# Patient Record
Sex: Male | Born: 1965 | Race: White | Hispanic: No | Smoking: Current every day smoker
Health system: Southern US, Community
[De-identification: ages and names within clinical notes are randomized; demographics above are authoritative.]

## PROBLEM LIST (undated history)

## (undated) DIAGNOSIS — J449 Chronic obstructive pulmonary disease, unspecified: Secondary | ICD-10-CM

## (undated) DIAGNOSIS — I10 Essential (primary) hypertension: Secondary | ICD-10-CM

## (undated) DIAGNOSIS — K219 Gastro-esophageal reflux disease without esophagitis: Secondary | ICD-10-CM

## (undated) DIAGNOSIS — R011 Cardiac murmur, unspecified: Secondary | ICD-10-CM

## (undated) DIAGNOSIS — F32A Depression, unspecified: Secondary | ICD-10-CM

## (undated) DIAGNOSIS — M1990 Unspecified osteoarthritis, unspecified site: Secondary | ICD-10-CM

## (undated) DIAGNOSIS — F419 Anxiety disorder, unspecified: Secondary | ICD-10-CM

## (undated) HISTORY — DX: Gastro-esophageal reflux disease without esophagitis: K21.9

## (undated) HISTORY — DX: Cardiac murmur, unspecified: R01.1

## (undated) HISTORY — DX: Depression, unspecified: F32.A

## (undated) HISTORY — DX: Anxiety disorder, unspecified: F41.9

## (undated) HISTORY — DX: Unspecified osteoarthritis, unspecified site: M19.90

---

## 2006-03-26 ENCOUNTER — Other Ambulatory Visit: Payer: Self-pay

## 2006-03-26 ENCOUNTER — Emergency Department: Payer: Self-pay | Admitting: Unknown Physician Specialty

## 2006-03-28 ENCOUNTER — Ambulatory Visit: Payer: Self-pay | Admitting: Unknown Physician Specialty

## 2006-04-17 ENCOUNTER — Emergency Department: Payer: Self-pay | Admitting: Internal Medicine

## 2007-07-08 IMAGING — CT CT HEAD WITHOUT CONTRAST
2 series · 16 of 30 positions shown, 20 images · non-contrast
Comparison: none

REASON FOR EXAM: Blurred vision, unsteady gait.
COMMENTS:

PROCEDURE:     CT  - CT HEAD WITHOUT CONTRAST  - March 26, 2006  [DATE]
RESULT:     No intraaxial or extraaxial pathologic fluid or blood
collections are identified.  No mass lesions are noted.  There is no
hydrocephalus.  No bony abnormalities are noted.

[Series 2: without · axial · non-contrast · 0.38mm/px · z∈[-627,-507]mm · 13 of 30 slices shown, 17 images]
[im 3/30  brain]
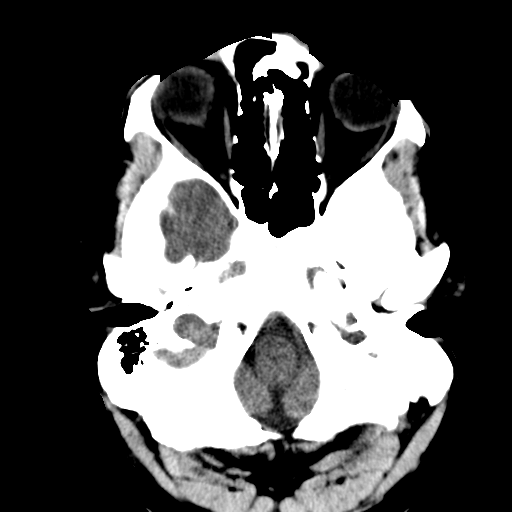
[im 3/30  bone]
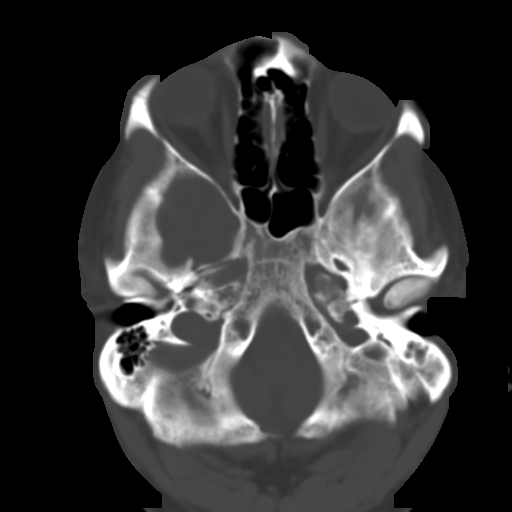
[im 5/30  brain]
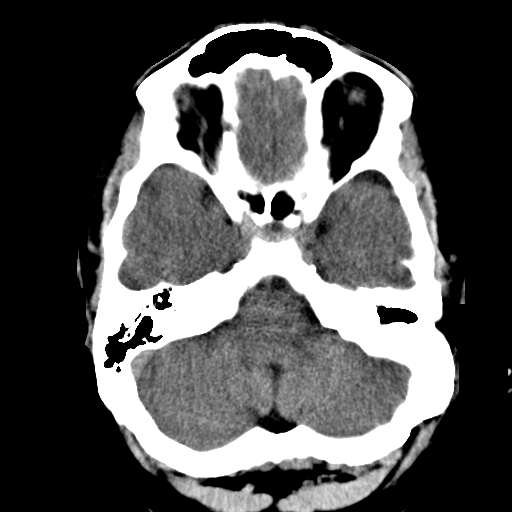
[im 7/30  brain]
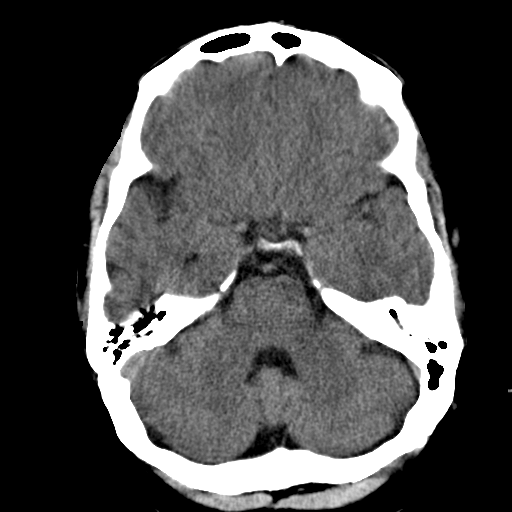
[im 9/30  brain]
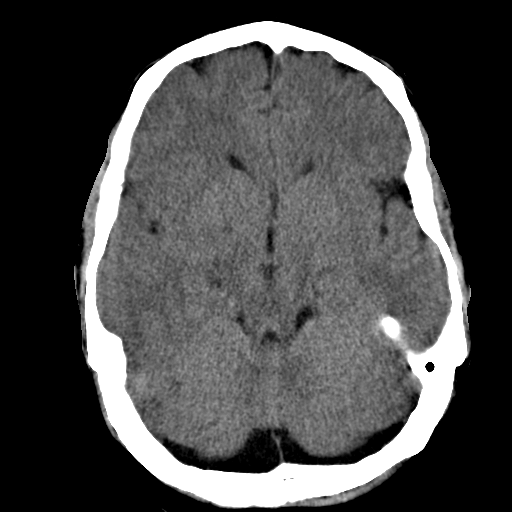
[im 11/30  brain]
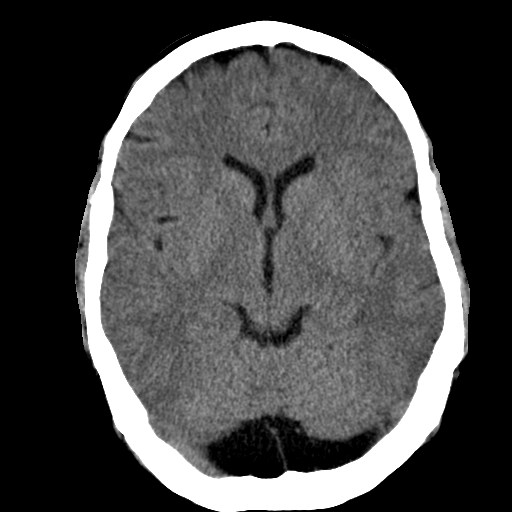
[im 11/30  bone]
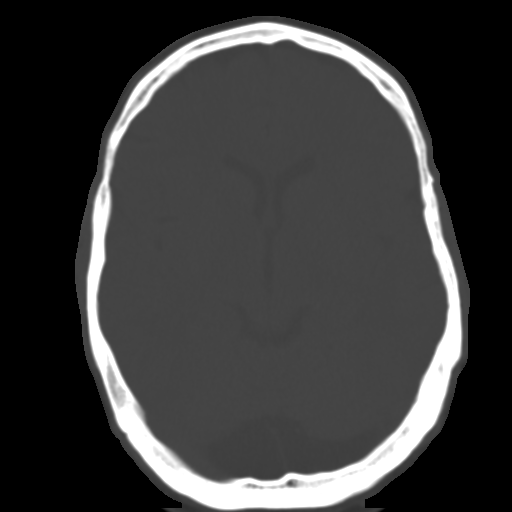
[im 13/30  brain]
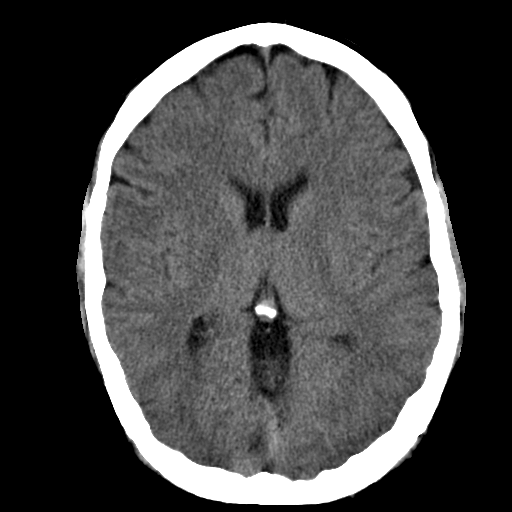
[im 15/30  brain]
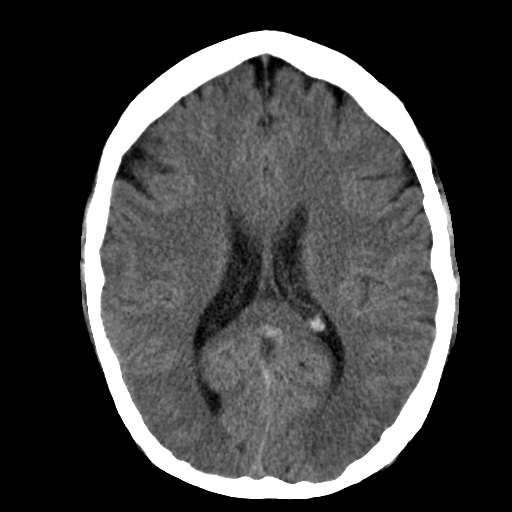
[im 17/30  brain]
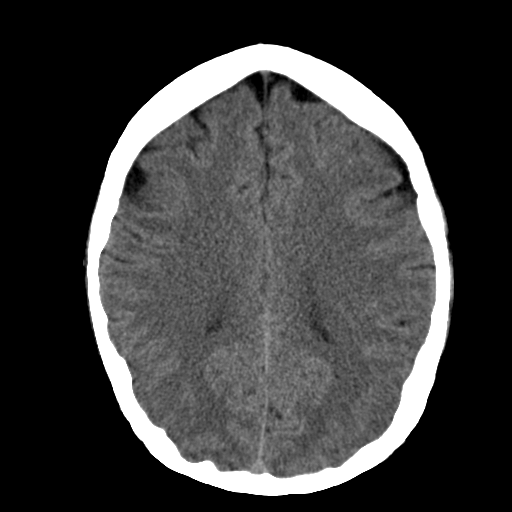
[im 19/30  brain]
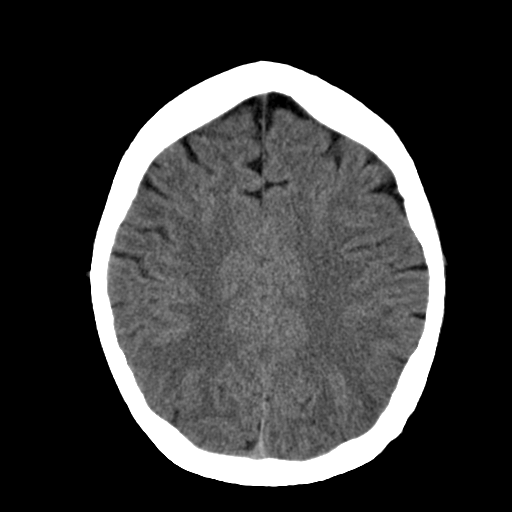
[im 19/30  bone]
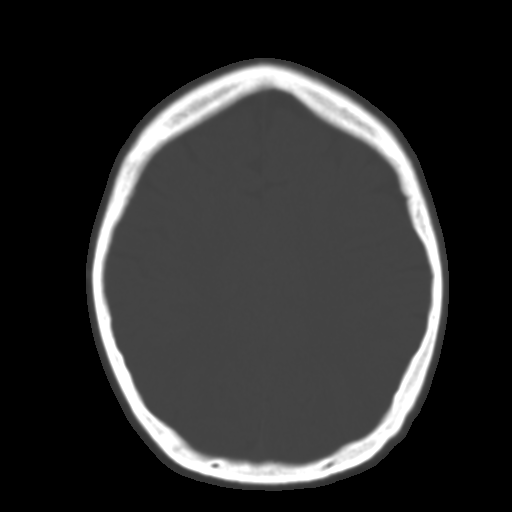
[im 21/30  brain]
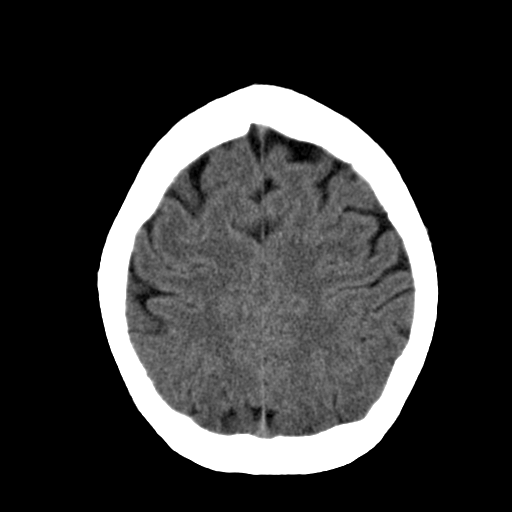
[im 23/30  brain]
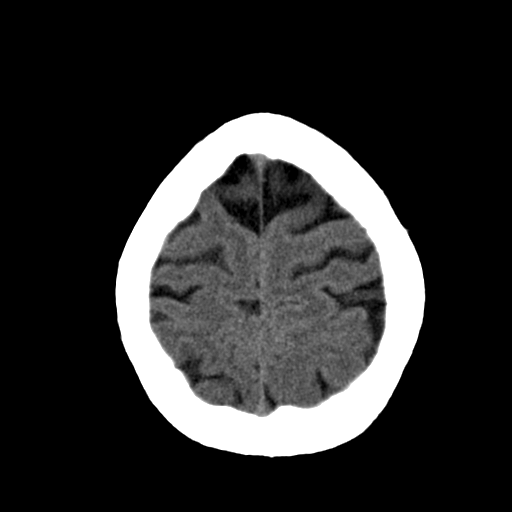
[im 25/30  brain]
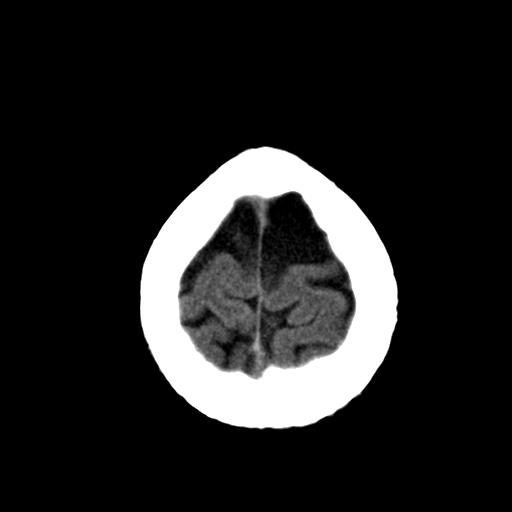
[im 27/30  brain]
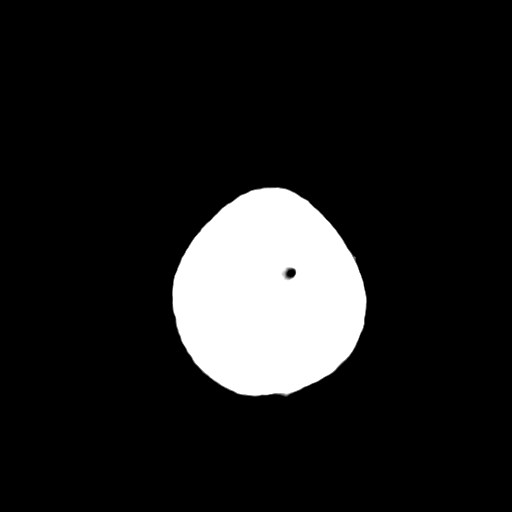
[im 27/30  bone]
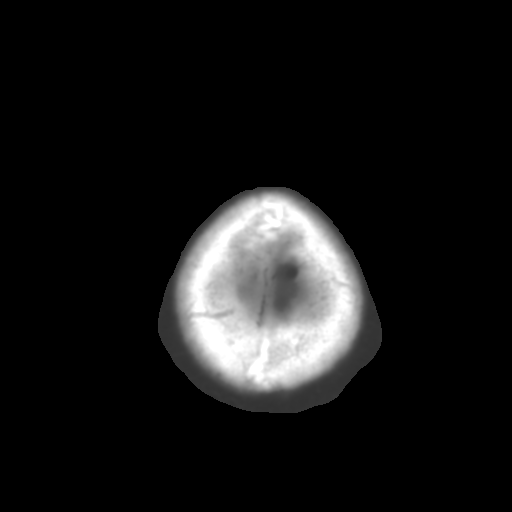

[Series 3: bone · axial · 0.38mm/px · z∈[-627,-587]mm · 3 of 30 slices shown]
[im 3/30  bone]
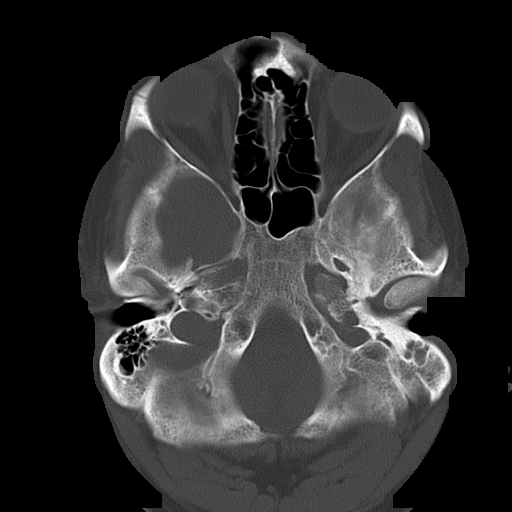
[im 7/30  bone]
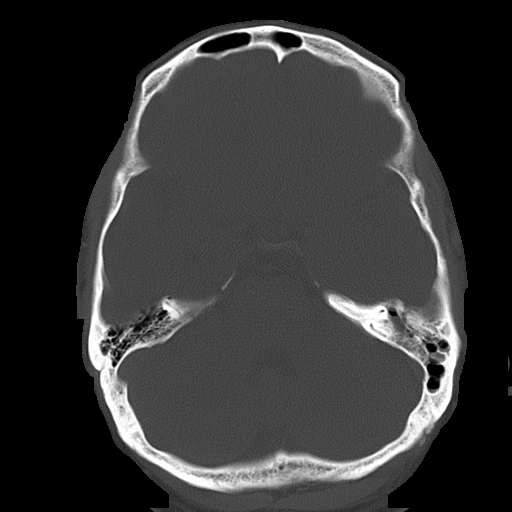
[im 11/30  bone]
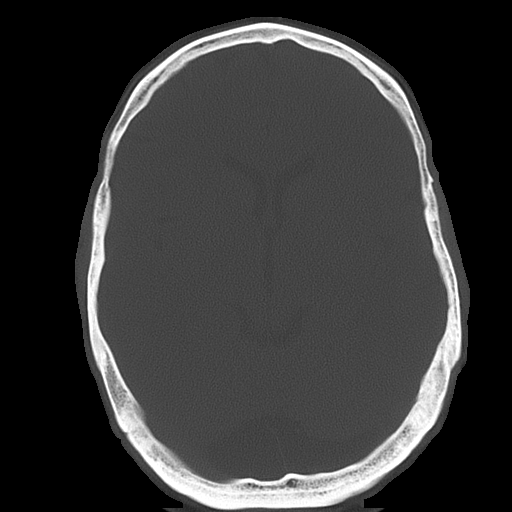

[16 of 30 positions shown; findings below may reference images not displayed]

IMPRESSION: No acute intracranial abnormalities identified.  This report was phoned to
the emergency room physician at the time of the study.

## 2007-07-10 IMAGING — US US CAROTID DUPLEX BILAT
1 series · 17 of 24 positions shown · non-contrast
Comparison: none

REASON FOR EXAM: Vertigo
COMMENTS:

[Series 1: us carotid duplex bilat · 17 of 66 slices shown]
[im 1/66]
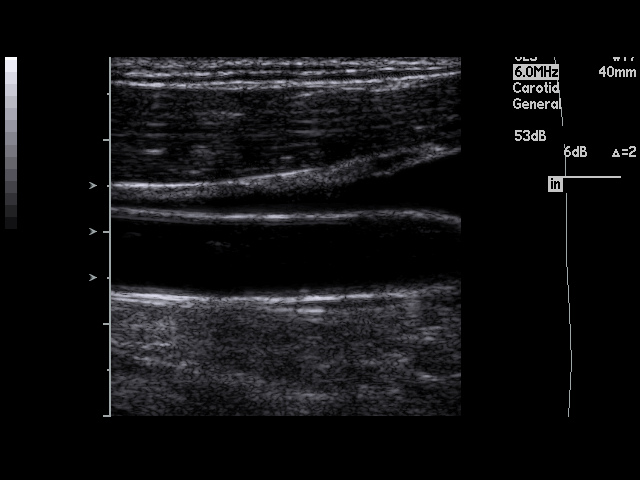
[im 6/66]
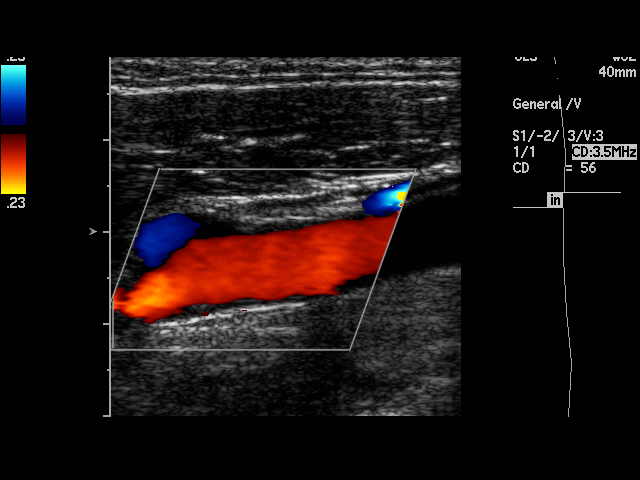
[im 9/66]
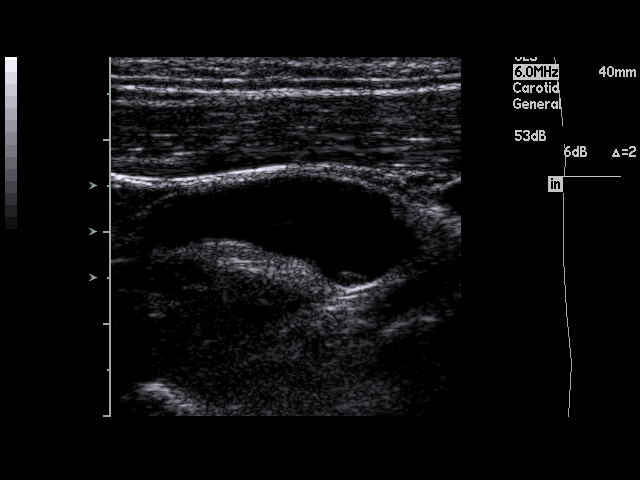
[im 12/66]
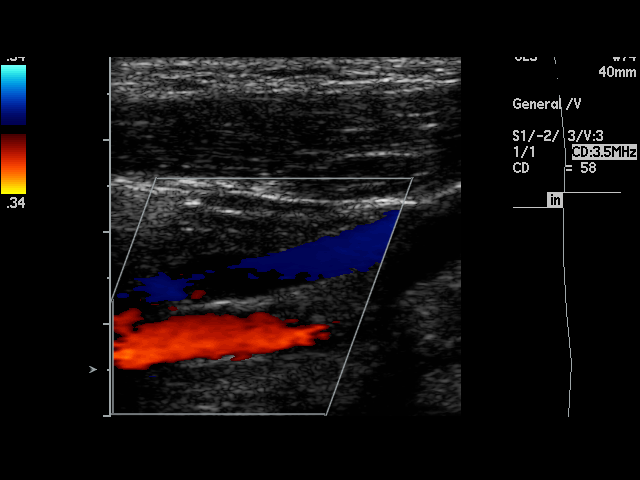
[im 17/66]
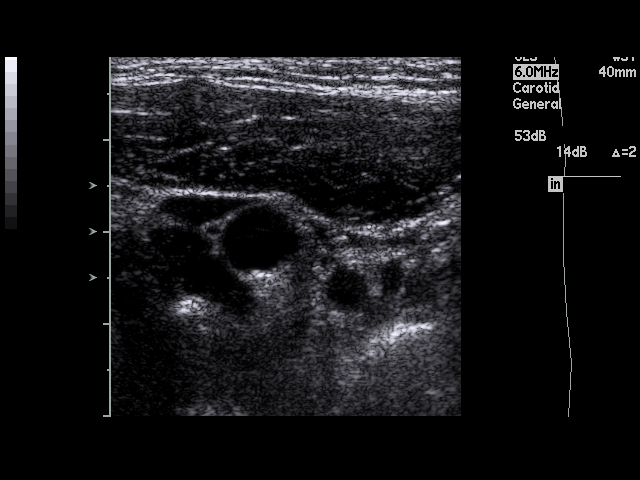
[im 20/66]
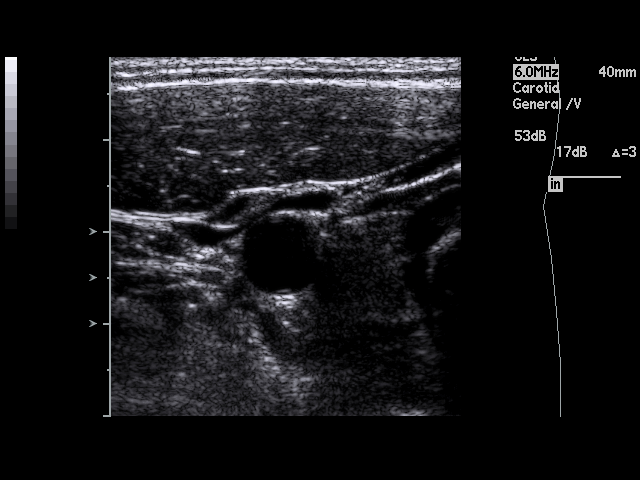
[im 26/66]
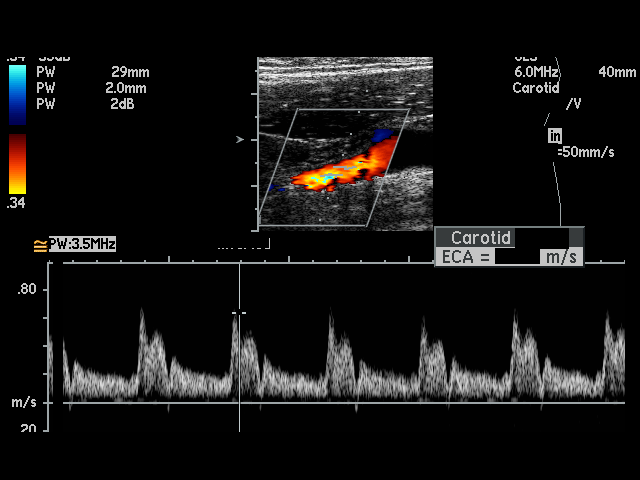
[im 29/66]
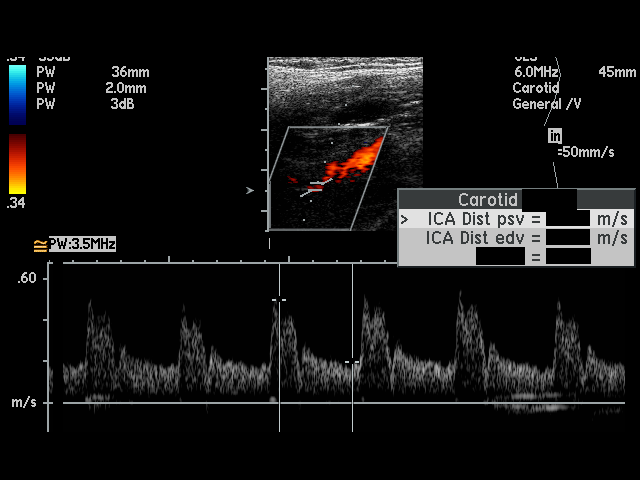
[im 34/66]
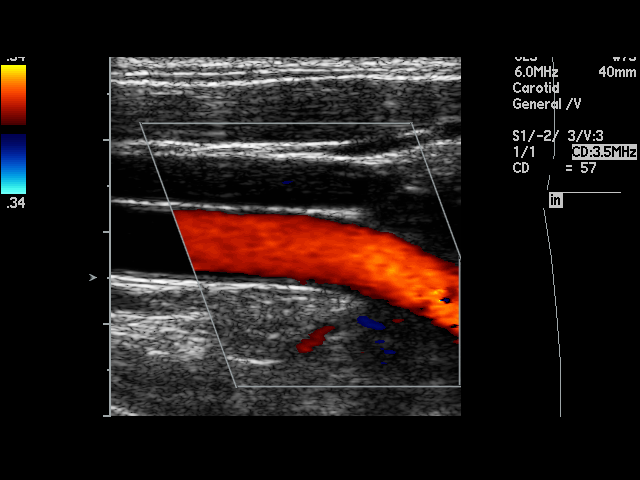
[im 37/66]
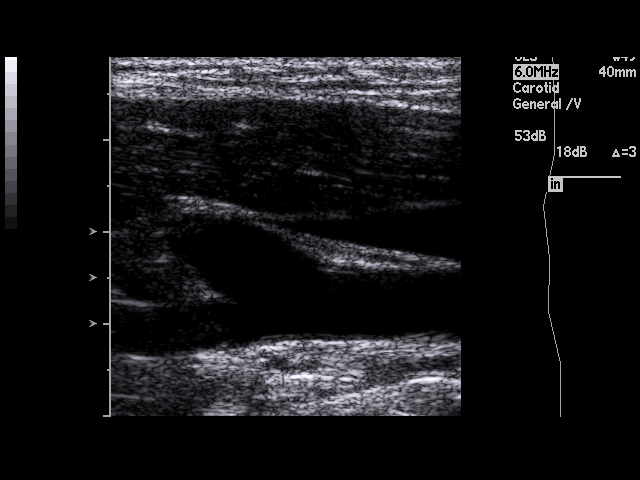
[im 40/66]
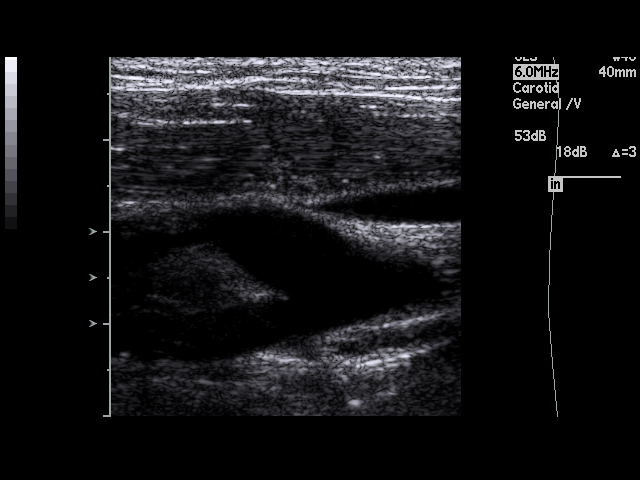
[im 46/66]
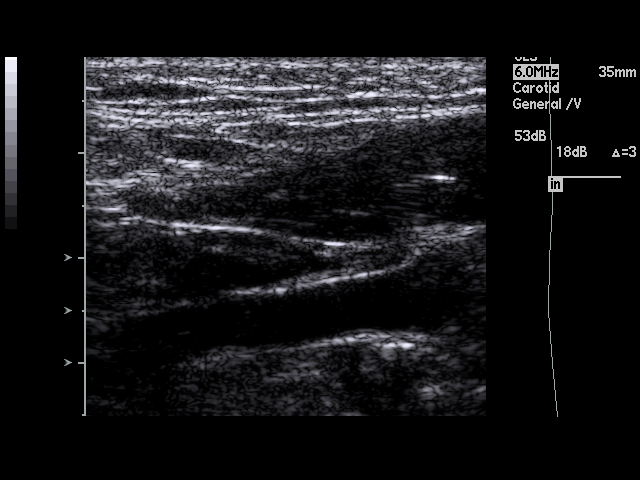
[im 49/66]
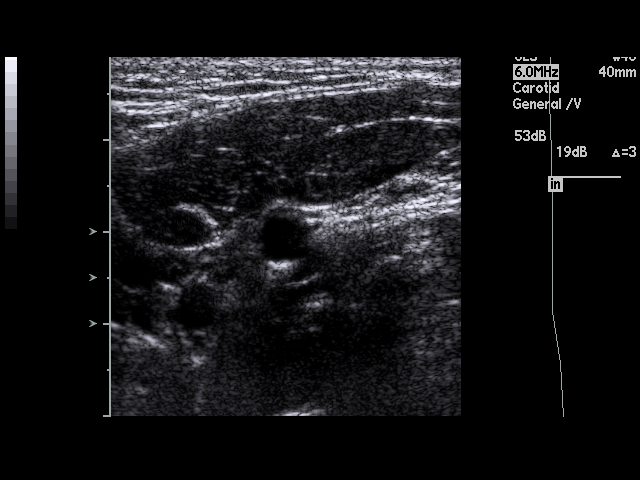
[im 54/66]
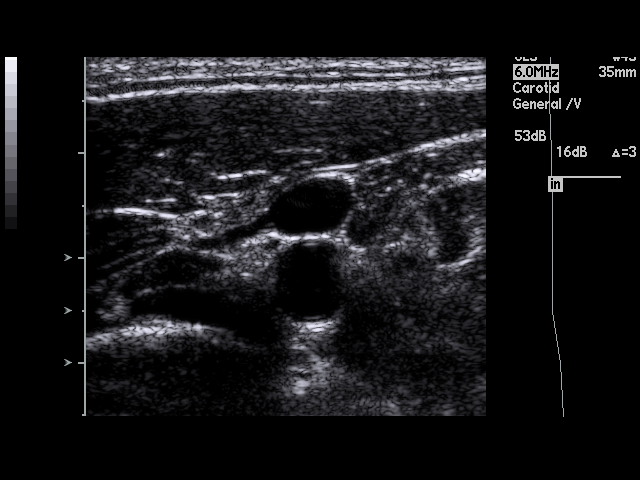
[im 57/66]
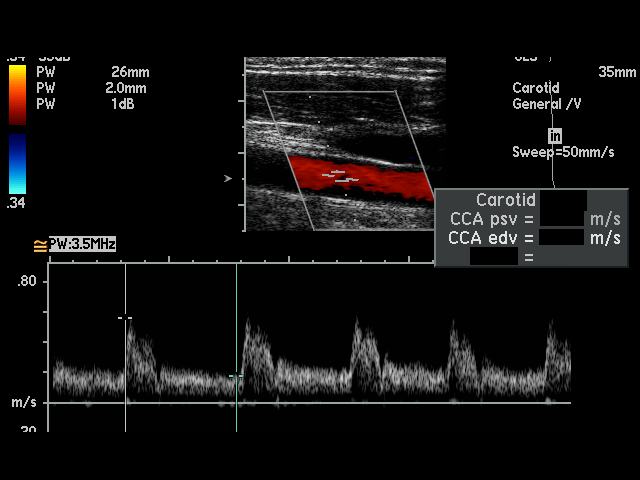
[im 60/66]
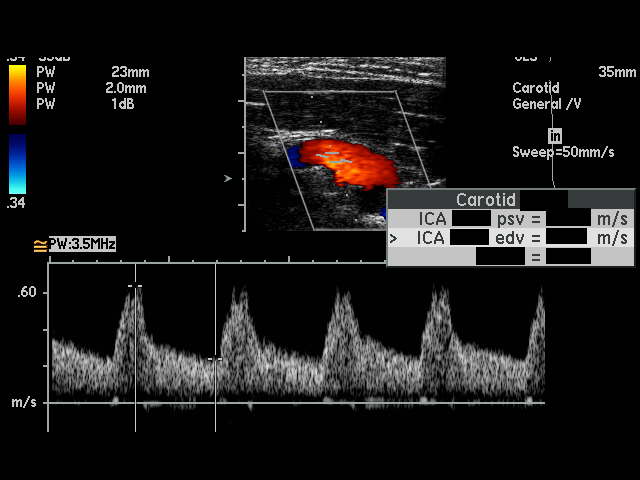
[im 66/66]
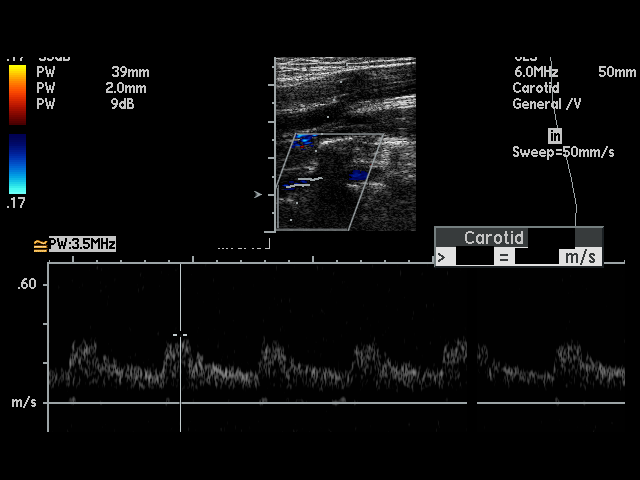

[17 of 24 positions shown; findings below may reference images not displayed]

PROCEDURE:     US  - US CAROTID DOPPLER BILATERAL  - March 28, 2006 [DATE]

RESULT:     There is noted slight soft plaque formation about the carotid
bifurcation on the RIGHT. No plaque formation is seen on the LEFT. On the
RIGHT the peak RIGHT common carotid artery flow velocity measures .730
meters per second and the peak RIGHT internal carotid artery flow velocity
measures .651 meters per second. The IC/CC ratio is 0.892.  On the LEFT the
peak LEFT common carotid artery flow velocity measures .757 meters per
second and the peak LEFT internal carotid artery flow velocity measures .677
meters per second. The IC/CC ratio is 0.894.  These values bilaterally are
compatible with the absence of hemodynamically significant stenosis.

Antegrade flow is noted in both vertebrals.
IMPRESSION: 1)No significant plaque formation or stenosis is seen on either side.

2)Antegrade flow is present in both vertebrals.

## 2007-11-08 ENCOUNTER — Emergency Department: Payer: Self-pay | Admitting: Emergency Medicine

## 2008-01-03 ENCOUNTER — Emergency Department: Payer: Self-pay | Admitting: Emergency Medicine

## 2009-02-19 IMAGING — CR DG ANKLE COMPLETE 3+V*L*
1 series · 5 of 5 positions shown · non-contrast
Comparison: none

REASON FOR EXAM: INJURY HEARD ANKLE POP
COMMENTS:

[Series 1: view not recorded · 0.17mm/px · 5 of 5 slices shown]
[im 1/5]
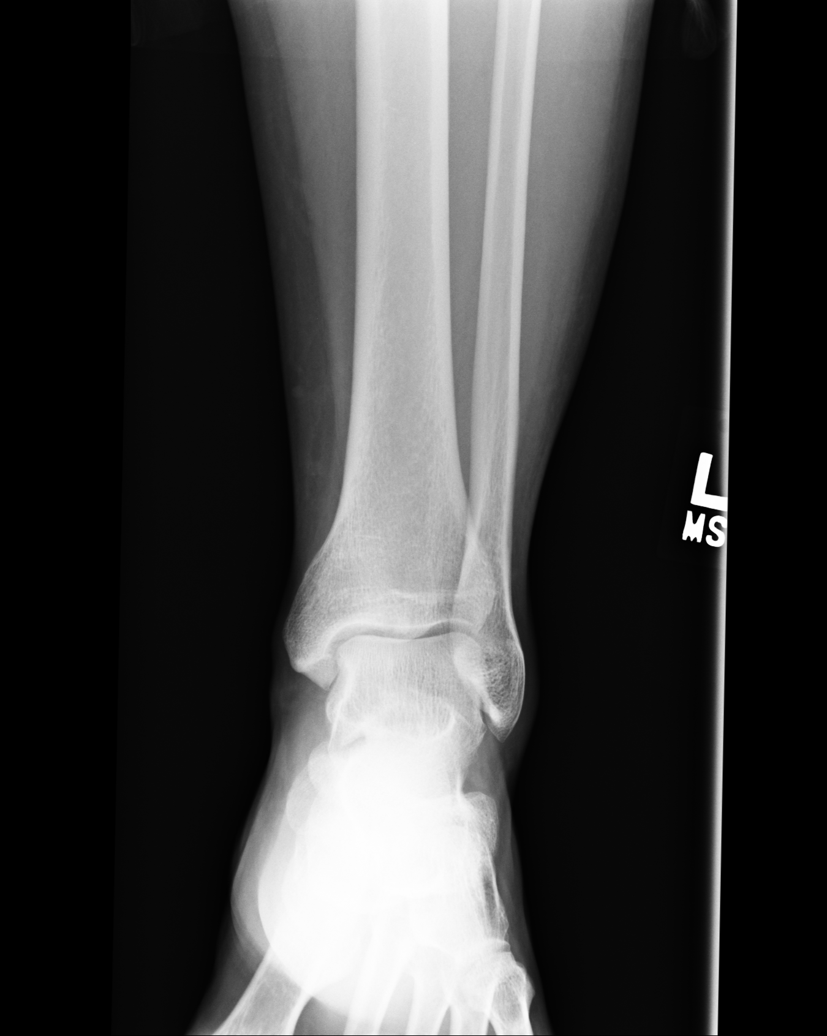
[im 2/5]
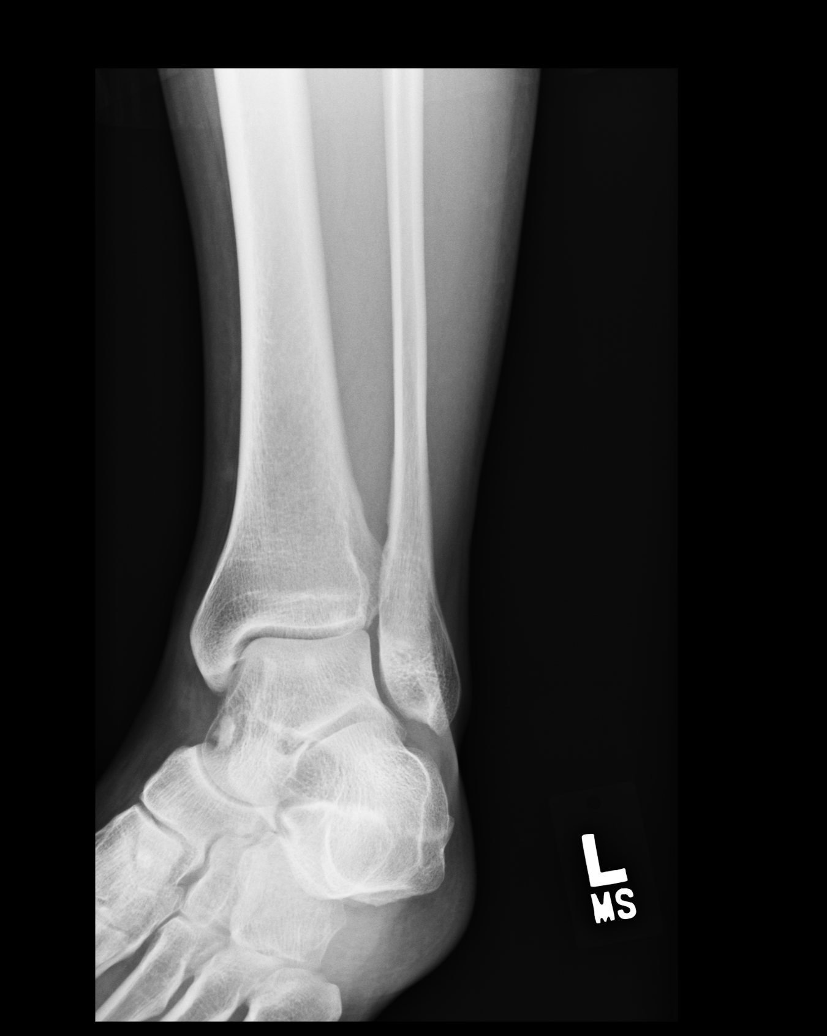
[im 3/5]
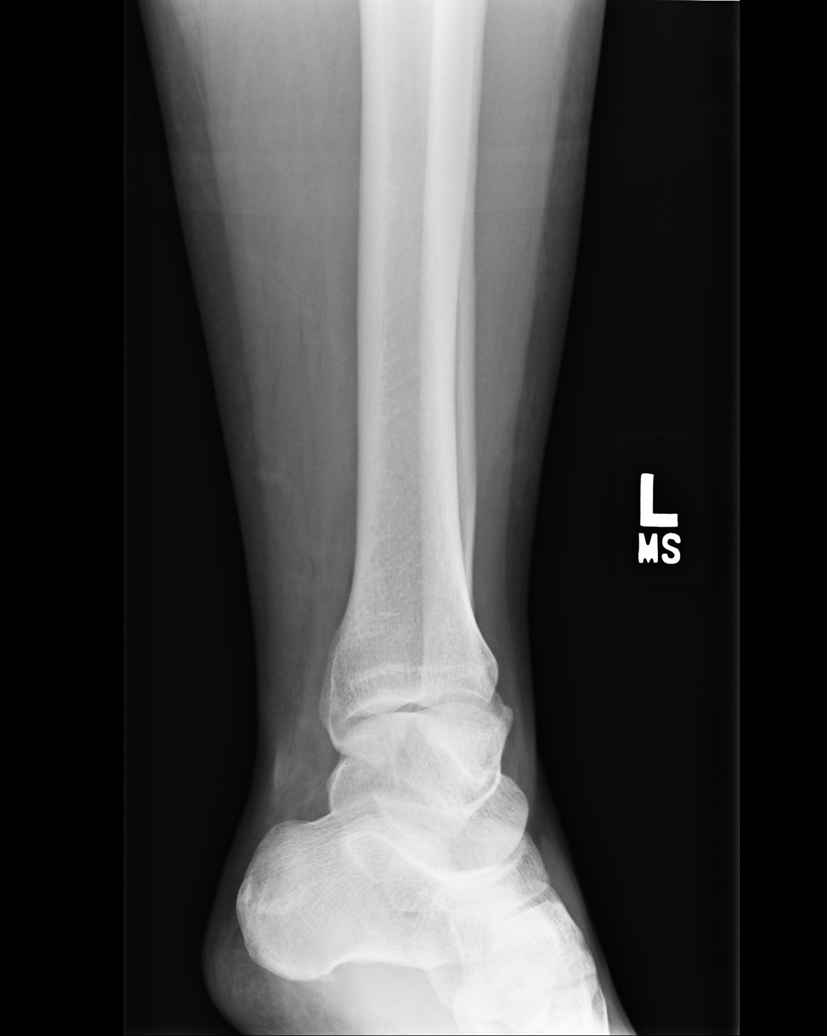
[im 4/5]
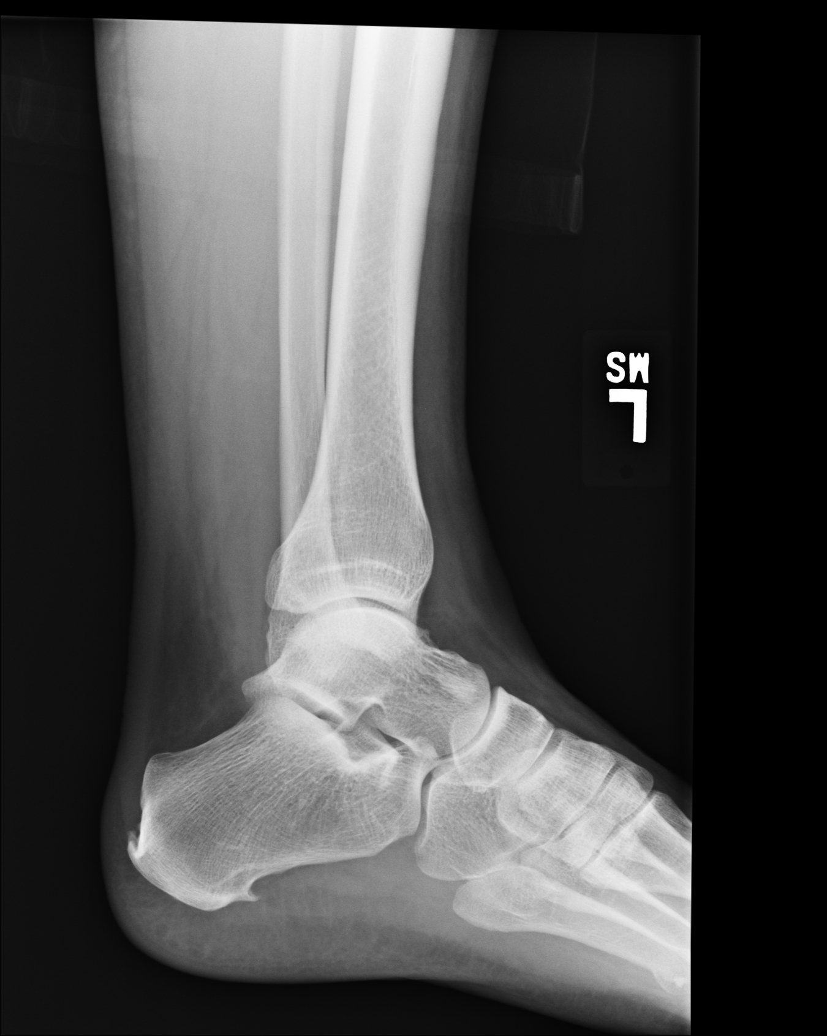
[im 5/5]
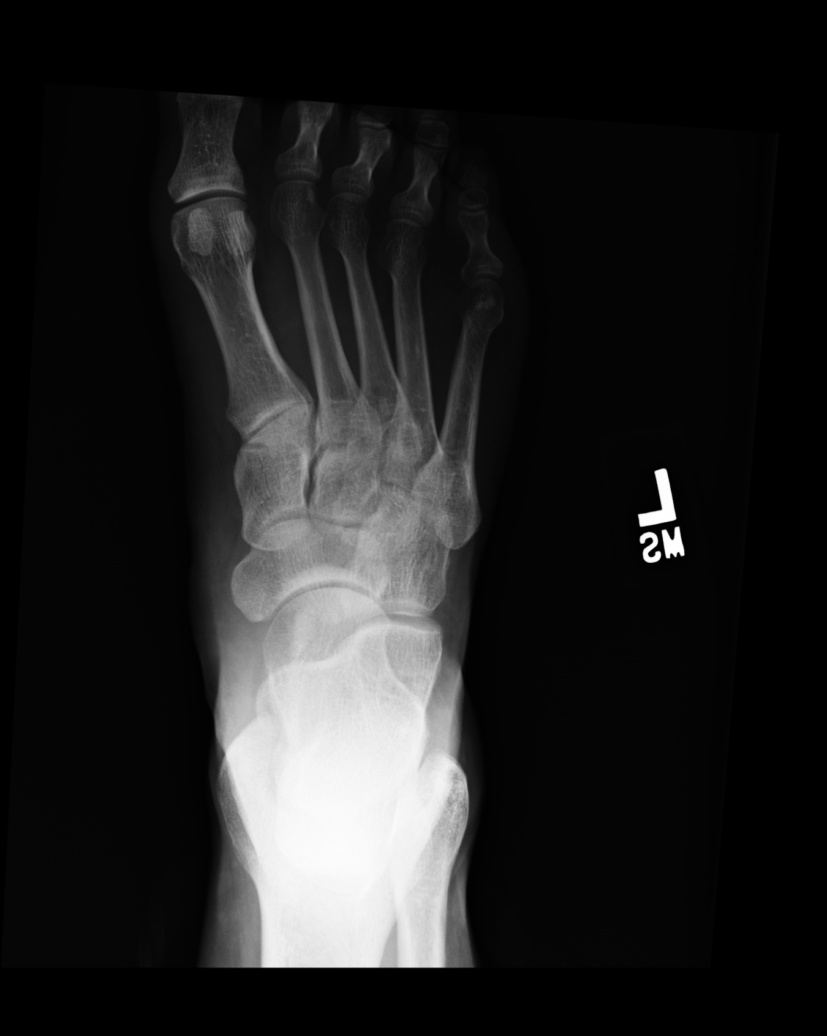

[5 of 5 positions shown; findings below may reference images not displayed]

PROCEDURE:     DXR - DXR ANKLE LEFT COMPLETE  - November 08, 2007 [DATE]

RESULT:     Five views of the left ankle reveal the joint mortise to be
preserved. The talar dome is intact. I do not see evidence of an acute
malleolar fracture. There are plantar and Achilles region calcaneal spurs.
The metatarsal bases appear intact.
IMPRESSION: I do not see evidence of acute fracture of the left ankle.
No significant soft tissue swelling is identified. Followup imaging is
available if the patient's symptoms persist.

## 2009-03-19 ENCOUNTER — Emergency Department: Payer: Self-pay | Admitting: Emergency Medicine

## 2010-10-05 ENCOUNTER — Emergency Department: Payer: Self-pay | Admitting: Emergency Medicine

## 2022-05-23 ENCOUNTER — Emergency Department: Payer: Self-pay

## 2022-05-23 ENCOUNTER — Emergency Department
Admission: EM | Admit: 2022-05-23 | Discharge: 2022-05-23 | Disposition: A | Discharge status: Home / Self Care | Payer: Self-pay | Attending: Emergency Medicine | Admitting: Emergency Medicine

## 2022-05-23 ENCOUNTER — Encounter: Payer: Self-pay | Admitting: Emergency Medicine

## 2022-05-23 DIAGNOSIS — X58XXXA Exposure to other specified factors, initial encounter: Secondary | ICD-10-CM | POA: Insufficient documentation

## 2022-05-23 DIAGNOSIS — R030 Elevated blood-pressure reading, without diagnosis of hypertension: Secondary | ICD-10-CM

## 2022-05-23 DIAGNOSIS — S0990XA Unspecified injury of head, initial encounter: Secondary | ICD-10-CM | POA: Insufficient documentation

## 2022-05-23 DIAGNOSIS — J449 Chronic obstructive pulmonary disease, unspecified: Secondary | ICD-10-CM | POA: Insufficient documentation

## 2022-05-23 DIAGNOSIS — Z008 Encounter for other general examination: Secondary | ICD-10-CM

## 2022-05-23 HISTORY — DX: Essential (primary) hypertension: I10

## 2022-05-23 HISTORY — DX: Chronic obstructive pulmonary disease, unspecified: J44.9

## 2022-05-23 MED ORDER — LOSARTAN POTASSIUM 25 MG PO TABS: 25.0000 mg | ORAL_TABLET | Freq: Every day | ORAL | 0 refills | Status: DC

## 2022-05-23 MED ORDER — ALBUTEROL SULFATE HFA 108 (90 BASE) MCG/ACT IN AERS: 2.0000 | INHALATION_SPRAY | Freq: Four times a day (QID) | RESPIRATORY_TRACT | 2 refills | Status: DC | PRN

## 2022-05-23 NOTE — Discharge Instructions (Signed)
Your CT scans were normal.  Your medications were refilled for you.  Please return for any new, worsening, or changing symptoms or other concerns.

## 2022-05-23 NOTE — ED Provider Notes (Signed)
[facility] Provider Note    Event Date/Time   First MD Initiated Contact with Patient 05/23/22 2045     (approximate)   History   Medical Clearance   HPI  Randall Harris is a 56 y.o. male who presents today with [facility] department under custody for medical clearance to go to jail.  He was found to have a high blood pressure.  He reports he has a history of hypertension but did not take his medication today.  He reports that he takes losartan 25 mg.  He reports that he has been drinking alcohol today.  He hit his head approximately 10 hours prior to arrival.  Reports that he stood up quickly and hit his head on the shelf above him.  He has been feeling nauseated but has not had any vomiting.  He denies any history of alcohol withdrawal.  He denies any other pain.  He reports that he also supposed to take inhalers but did not take them today.  He does not feel more short of breath than his baseline.  No chest pain.  There are no problems to display for this patient.         Physical Exam   Triage Vital Signs: ED Triage Vitals [05/23/22 2002]  Enc Vitals Group     BP (!) 148/94     Pulse Rate 82     Resp 20     Temp 98.2 F (36.8 C)     Temp Source Oral     SpO2 96 %     Weight 240 lb (108.9 kg)     Height 5\' 9"  (1.753 m)     Head Circumference      Peak Flow      Pain Score      Pain Loc      Pain Edu?      Excl. in GC?     Most recent vital signs: Vitals:   05/23/22 2002 05/23/22 2221  BP: (!) 148/94 (!) 144/94  Pulse: 82 95  Resp: 20 18  Temp: 98.2 F (36.8 C)   SpO2: 96% 96%    Physical Exam Vitals and nursing note reviewed.  Constitutional:      General: Awake and alert. No acute distress.    Appearance: Normal appearance. The patient is normal weight.  Smells of EtOH HENT:     Head: Normocephalic.  Dried blood to top of head, no active bleeding, no laceration    Mouth: Mucous membranes are moist.  No tongue  fasciculations Eyes:     General: PERRL. Normal EOMs        Right eye: No discharge.        Left eye: No discharge.     Conjunctiva/sclera: Conjunctivae normal.  Cardiovascular:     Rate and Rhythm: Normal rate and regular rhythm.     Pulses: Normal pulses.     Heart sounds: Normal heart sounds Pulmonary:     Effort: Pulmonary effort is normal. No respiratory distress.     Breath sounds: Normal breath sounds.  Abdominal:     Abdomen is soft. There is no abdominal tenderness. No rebound or guarding. No distention. Musculoskeletal:        General: No swelling. Normal range of motion.     Cervical back: Normal range of motion and neck supple.  No midline cervical spine tenderness.  Full range of motion of neck.  Negative Spurling test.  Negative Lhermitte sign.  Normal  strength and sensation in bilateral upper extremities. Normal grip strength bilaterally.  Normal intrinsic muscle function of the hand bilaterally.  Normal radial pulses bilaterally. Skin:    General: Skin is warm and dry.     Capillary Refill: Capillary refill takes less than 2 seconds.     Findings: No rash.  Neurological:     Mental Status: The patient is awake and alert.  Neurological: GCS 15 alert and oriented x3 Normal speech, no expressive or receptive aphasia or dysarthria Cranial nerves II through XII intact Normal visual fields 5 out of 5 strength in all 4 extremities with intact sensation throughout No extremity drift Normal finger-to-nose testing, no limb or truncal ataxia     ED Results / Procedures / Treatments   Labs (all labs ordered are listed, but only abnormal results are displayed) Labs Reviewed - No data to display   EKG     RADIOLOGY I independently reviewed and interpreted imaging and agree with radiologists findings.     PROCEDURES:  Critical Care performed:   Procedures   MEDICATIONS ORDERED IN ED: Medications - No data to display   IMPRESSION / MDM / ASSESSMENT AND  PLAN / ED COURSE  I reviewed the triage vital signs and the nursing notes.   Differential diagnosis includes, but is not limited to, alcohol intoxication, COPD exacerbation, medication noncompliance, intracranial hemorrhage, concussion.  Patient is awake and alert, hemodynamically stable and neurologically intact.  He is answering questions appropriately though smells faintly of alcohol.  No tongue fasciculations, does not appear to be in alcohol withdrawal.  He is mildly hypertensive to 148/94, but admits to not taking his losartan today.  He felt slightly short of breath when he was being arrested but this improved once the window was opened in the police car.  He also reports that he has not taken his Ventolin today.  He is able to speak easily in complete sentences, lungs are clear to auscultation bilaterally, he has a normal oxygen saturation of 96% on room air and demonstrates no increased work of breathing.  He currently does not have any shortness of breath or chest pain.  He did strike his head today, and admits to drinking margaritas, therefore CT head and neck obtained per [place] criteria.  There was no LOC, no vomiting.  CT scans were normal.  Chest x-ray demonstrates no cardiopulmonary abnormality.  His blood pressure medication and inhaler were refilled for him.  He was discharged in police custody.  Patient and officer understand return precautions.   Patient's presentation is most consistent with acute complicated illness / injury requiring diagnostic workup.    FINAL CLINICAL IMPRESSION(S) / ED DIAGNOSES   Final diagnoses:  Elevated blood pressure reading  Chronic obstructive pulmonary disease, unspecified COPD type (HCC)  Medical clearance for incarceration  Injury of head, initial encounter     Rx / DC Orders   ED Discharge Orders          Ordered    losartan (COZAAR) 25 MG tablet  Daily        05/23/22 2216    albuterol (VENTOLIN HFA) 108 (90 Base) MCG/ACT inhaler   Every 6 hours PRN        05/23/22 2216             Note:  This document was prepared using Dragon voice recognition software and may include unintentional dictation errors.   Keturah Shavers 05/23/22 2227    Chesley Noon, MD 05/26/22 [phone]

## 2022-05-23 NOTE — ED Notes (Signed)
Patient discharged at this time. Ambulated to lobby with independent and steady gait. Breathing unlabored speaking in full sentences. Verbalized understanding of all discharge, follow up, and medication teaching. Discharged homed with all belongings.   Discharged with [facility] to jail.

## 2022-05-23 NOTE — ED Triage Notes (Signed)
Pt arrived via [facility] department, under custody, for medical clearance to go to jail after pt found to have high blood pressure. Pt has hx/o CPOD and hypertension. Pt reports he did not take any of his prescribed medication today. Pt ambulatory with steady gait, even respirations and clear speech on arrival.

## 2024-08-12 ENCOUNTER — Ambulatory Visit

## 2024-08-12 VITALS — BP 166/116 | HR 96 | Ht 70.0 in | Wt 238.6 lb

## 2024-08-12 DIAGNOSIS — M549 Dorsalgia, unspecified: Secondary | ICD-10-CM | POA: Insufficient documentation

## 2024-08-12 DIAGNOSIS — I158 Other secondary hypertension: Secondary | ICD-10-CM

## 2024-08-12 DIAGNOSIS — M545 Low back pain, unspecified: Secondary | ICD-10-CM | POA: Diagnosis not present

## 2024-08-12 DIAGNOSIS — Z122 Encounter for screening for malignant neoplasm of respiratory organs: Secondary | ICD-10-CM

## 2024-08-12 DIAGNOSIS — G8929 Other chronic pain: Secondary | ICD-10-CM

## 2024-08-12 DIAGNOSIS — Z720 Tobacco use: Secondary | ICD-10-CM | POA: Diagnosis not present

## 2024-08-12 DIAGNOSIS — I10 Essential (primary) hypertension: Secondary | ICD-10-CM | POA: Insufficient documentation

## 2024-08-12 DIAGNOSIS — E66811 Obesity, class 1: Secondary | ICD-10-CM | POA: Insufficient documentation

## 2024-08-12 MED ORDER — LOSARTAN POTASSIUM 50 MG PO TABS: 50.0000 mg | ORAL_TABLET | Freq: Every day | ORAL | 1 refills | Status: DC

## 2024-08-12 NOTE — Progress Notes (Signed)
 New Patient Visit   Physician: Neev Mcmains A Emersyn Wyss, MD  Patient: Randall Harris   DOB: Nov 26, 1965   58 y.o. Male  MRN: [id number] Visit Date: 08/12/2024   Chief Complaint  Patient presents with   Establish Care   Subjective  Randall Harris is a 58 y.o. male who presents today as a new patient to establish care.   HPI  Discussed the use of AI scribe software for clinical note transcription with the patient, who gave verbal consent to proceed.   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, results unavailable - Previously treated with gabapentin 800 mg three times daily, discontinued due to lack of efficacy  - Does not exercise, has never done PT, not taking medication for pain, has not had MRI imaging  - Is pursuing disability   Tobacco and alcohol use - Smokes approximately three packs of cigarettes daily, - Smoking history of approximately 25 years - Consumes about a twelve-pack of alcohol weekly - No illicit drug use  Cardiac symptoms and history - Known heart murmur - Occasional chest pain - does not exert himself - No recent cardiac imaging or interventions  Sleep disturbance - Averages 5-6 hours of sleep per night  Dietary habits - Diet low in vegetables  Psychosocial stressors - Increased stress related to unemployment and disability filing - History of incarceration in [place] from 2013 to 2017  Preventive care and laboratory monitoring - No blood work performed in approximately two years         ASSESSMENT & PLAN  Encounter  Diagnoses  Name Primary?   Other secondary hypertension Yes   Obesity (BMI 30.0-34.9)    Tobacco abuse    Screening for lung cancer    Chronic low back pain without sciatica, unspecified back pain laterality     Orders Placed This Encounter  Procedures   CT CHEST LUNG CANCER SCREENING LOW DOSE WO CONTRAST   CBC with Differential/Platelet   Comprehensive metabolic panel with GFR   Hemoglobin A1c   Lipid panel   Urinalysis, Routine w reflex microscopic   PSA    Assessment and Plan    Hypertension   Continue losartan  50 mg daily. Obtain a home blood pressure cuff and maintain a blood pressure log. Aim for a target of 130/80 mmHg or less.  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 Provide exercises and stretches for pain management and advise daily walking. He should be doing PT and PT with other medication trials prior to receiving disability.  He has not had appropriate imaging.   Tobacco use disorder   Long-standing tobacco use disorder with current consumption of 3 packs per day, Order screening CT for lung cancer. Advise reducing smoking to a pack per day and encourage finding alternative activities to reduce smoking.  Financial strain likely increased by smoking habit at this level  Depression and anxiety   Symptoms are exacerbated by financial stress, not on medication  Obesity   Obesity  contributes to overall health issues, with limited physical activity due to back pain. Encourage daily walking to improve physical activity.   Diet could be improved  F/u in 3 weeks to review labs Declines vaccines    Objective  BP (!) 166/116   Pulse 96   Ht 5' 10 (1.778 m)   Wt 238 lb 9.6 oz (108.2 kg)   SpO2 97%   BMI 34.24 kg/m      Review of Systems  Constitutional:  Negative for chills, fever and weight loss.  Eyes:  Negative for blurred vision.  h Respiratory:  Negative for cough and shortness of breath.   Cardiovascular:  Negative for chest pain and palpitations.  Skin:  Negative for rash.  Psychiatric/Behavioral:  Negative for depression. The patient is not nervous/anxious.      Physical Exam Physical Exam Vitals reviewed.  Constitutional:      Appearance: Normal appearance. Obese HENT:     Head: Normocephalic and atraumatic.  Normal mucous membranes, no oral lesions Eyes:     Pupils: Pupils are equal, round, and reactive to light.  Neck:     Thyroid: No thyroid mass or thyromegaly.  Cardiovascular:     Rate and Rhythm: Normal rate and regular rhythm. Normal heart sounds. Normal peripheral pulses Pulmonary:     Normal breath sounds with normal effort Abdominal:   Abdomen is soft, without tenderness or noted hepatosplenomegaly Musculoskeletal:        General: No swelling or edema  Lymphadenopathy:     Cervical: No cervical adenopathy.  Skin:    General: Skin is warm and dry without noticeable rash. Neurological:     General: No focal deficit present.  Psychiatric:        Mood and Affect: Mood, behavior and cognition normal   Past Medical History:  Diagnosis Date   COPD (chronic obstructive pulmonary disease) (HCC)    Hypertension    History reviewed. No pertinent surgical history. No family status information on file.   History reviewed. No pertinent family history. Social History   Socioeconomic History   Marital status: Single    Spouse name: Not on file   Number of children: Not on file   Years of education: Not on file   Highest education level: GED or equivalent  Occupational History   Not on file  Tobacco Use   Smoking status: Every Day    Types: Cigarettes   Smokeless tobacco: Never  Substance and Sexual Activity   Alcohol use: Yes   Drug use: Yes   Sexual activity: Not on file  Other Topics Concern   Not on file  Social History Narrative   Not on file   Social Drivers of Health    Financial Resource Strain: High Risk (08/08/2024)   Overall Financial Resource Strain (CARDIA)    Difficulty of Paying Living Expenses: Very hard  Food Insecurity: Food Insecurity Present (08/08/2024)   Hunger Vital Sign    Worried About Running Out of Food in the Last Year: Often true    Ran Out of Food in the Last Year: Sometimes true  Transportation Needs: No Transportation Needs (08/08/2024)   PRAPARE - Administrator, Civil Service (Medical): No    Lack of Transportation (Non-Medical): No  Physical Activity: Inactive (08/08/2024)   Exercise Vital Sign    Days of Exercise per Week: 0 days    Minutes of Exercise per Session: Not on file  Stress: Stress Concern Present (08/08/2024)   [employer]  of Occupational Health - Occupational Stress Questionnaire    Feeling of Stress: Rather much  Social Connections: Socially Isolated (08/08/2024)   Social Connection and Isolation Panel    Frequency of Communication with Friends and Family: Once a week    Frequency of Social Gatherings with Friends and Family: Never    Attends Religious Services: Never    Database Administrator or Organizations: No    Attends Engineer, Structural: Not on file    Marital Status: Divorced   Outpatient Medications Prior to Visit  Medication Sig   [DISCONTINUED] albuterol  (VENTOLIN  HFA) 108 (90 Base) MCG/ACT inhaler Inhale 2 puffs into the lungs every 6 (six) hours as needed for wheezing or shortness of breath.   [DISCONTINUED] losartan  (COZAAR ) 25 MG tablet Take 1 tablet (25 mg total) by mouth daily for 5 days.   No facility-administered medications prior to visit.   No Known Allergies  Immunization History  Administered Date(s) Administered   Moderna Sars-Covid-2 Vaccination 11/14/2019, 12/12/2019, 08/17/2020    Health Maintenance  Topic Date Due   HIV Screening  Never done   Hepatitis C Screening  Never done   DTaP/Tdap/Td (1 - Tdap) Never done   Pneumococcal  Vaccine: 50+ Years (1 of 2 - PCV) Never done   Hepatitis B Vaccines 19-59 Average Risk (1 of 3 - 19+ 3-dose series) Never done   Colonoscopy  Never done   Zoster Vaccines- Shingrix (1 of 2) Never done   Influenza Vaccine  Never done   COVID-19 Vaccine (4 - 2025-26 season) 06/09/2024   HPV VACCINES  Aged Out   Meningococcal B Vaccine  Aged Out    Patient Care Team: Everlene Parris LABOR, MD as PCP - General (Family Medicine)  Depression Screen     No data to display           Parris LABOR Everlene, MD  [facility] [phone] (phone) [phone] (fax)  [facility]

## 2024-08-13 LAB — CBC WITH DIFFERENTIAL/PLATELET
Absolute Lymphocytes: 1862 {cells}/uL (ref 850–3900)
Absolute Monocytes: 723 {cells}/uL (ref 200–950)
Basophils Absolute: 29 {cells}/uL (ref 0–200)
Basophils Relative: 0.4 %
Eosinophils Absolute: 117 {cells}/uL (ref 15–500)
Eosinophils Relative: 1.6 %
HCT: 50 % (ref 38.5–50.0)
Hemoglobin: 15.8 g/dL (ref 13.2–17.1)
MCH: 27 pg (ref 27.0–33.0)
MCHC: 31.6 g/dL — ABNORMAL LOW (ref 32.0–36.0)
MCV: 85.5 fL (ref 80.0–100.0)
MPV: 10 fL (ref 7.5–12.5)
Monocytes Relative: 9.9 %
Neutro Abs: 4570 {cells}/uL (ref 1500–7800)
Neutrophils Relative %: 62.6 %
Platelets: 347 Thousand/uL (ref 140–400)
RBC: 5.85 Million/uL — ABNORMAL HIGH (ref 4.20–5.80)
RDW: 14.4 % (ref 11.0–15.0)
Total Lymphocyte: 25.5 %
WBC: 7.3 Thousand/uL (ref 3.8–10.8)

## 2024-08-13 LAB — COMPREHENSIVE METABOLIC PANEL WITH GFR
AG Ratio: 1.9 (calc) (ref 1.0–2.5)
ALT: 12 U/L (ref 9–46)
AST: 13 U/L (ref 10–35)
Albumin: 4.5 g/dL (ref 3.6–5.1)
Alkaline phosphatase (APISO): 80 U/L (ref 35–144)
BUN: 11 mg/dL (ref 7–25)
CO2: 26 mmol/L (ref 20–32)
Calcium: 9.3 mg/dL (ref 8.6–10.3)
Chloride: 108 mmol/L (ref 98–110)
Creat: 0.9 mg/dL (ref 0.70–1.30)
Globulin: 2.4 g/dL (ref 1.9–3.7)
Glucose, Bld: 89 mg/dL (ref 65–99)
Potassium: 4.1 mmol/L (ref 3.5–5.3)
Sodium: 142 mmol/L (ref 135–146)
Total Bilirubin: 0.4 mg/dL (ref 0.2–1.2)
Total Protein: 6.9 g/dL (ref 6.1–8.1)
eGFR: 100 mL/min/1.73m2 (ref >=60)

## 2024-08-13 LAB — URINALYSIS, ROUTINE W REFLEX MICROSCOPIC
Bacteria, UA: NONE SEEN /HPF
Bilirubin Urine: NEGATIVE
Glucose, UA: NEGATIVE
Hgb urine dipstick: NEGATIVE
Hyaline Cast: NONE SEEN /LPF
Ketones, ur: NEGATIVE
Leukocytes,Ua: NEGATIVE
Nitrite: NEGATIVE
Specific Gravity, Urine: 1.026 (ref 1.001–1.035)
Squamous Epithelial / HPF: NONE SEEN /HPF (ref <=5)
WBC, UA: NONE SEEN /HPF (ref 0–5)
pH: 6.5 (ref 5.0–8.0)

## 2024-08-13 LAB — LIPID PANEL
Cholesterol: 170 mg/dL (ref <200)
HDL: 59 mg/dL (ref >=40)
LDL Cholesterol (Calc): 85 mg/dL
Non-HDL Cholesterol (Calc): 111 mg/dL (ref <130)
Total CHOL/HDL Ratio: 2.9 (calc) (ref <5.0)
Triglycerides: 154 mg/dL — ABNORMAL HIGH (ref <150)

## 2024-08-13 LAB — MICROSCOPIC MESSAGE

## 2024-08-13 LAB — HEMOGLOBIN A1C
Hgb A1c MFr Bld: 5.4 % (ref <5.7)
Mean Plasma Glucose: 108 mg/dL
eAG (mmol/L): 6 mmol/L

## 2024-08-13 LAB — PSA: PSA: 2.1 ng/mL (ref <=4.00)

## 2024-08-21 ENCOUNTER — Ambulatory Visit

## 2024-08-21 ENCOUNTER — Telehealth: Payer: Self-pay

## 2024-08-21 VITALS — BP 141/94 | HR 110 | Ht 70.0 in | Wt 240.2 lb

## 2024-08-21 DIAGNOSIS — F32A Depression, unspecified: Secondary | ICD-10-CM

## 2024-08-21 DIAGNOSIS — I158 Other secondary hypertension: Secondary | ICD-10-CM

## 2024-08-21 DIAGNOSIS — Z720 Tobacco use: Secondary | ICD-10-CM

## 2024-08-21 MED ORDER — BUPROPION HCL ER (SR) 150 MG PO TB12: 150.0000 mg | ORAL_TABLET | Freq: Two times a day (BID) | ORAL | 1 refills | Status: AC

## 2024-08-21 MED ORDER — CHLORTHALIDONE 25 MG PO TABS: 25.0000 mg | ORAL_TABLET | Freq: Every day | ORAL | 1 refills | Status: AC

## 2024-08-21 NOTE — Telephone Encounter (Signed)
 Andrea called from [facility] about a pre cert for CT lung cancer screening. Is this something you can help with? Thanks!

## 2024-08-21 NOTE — Progress Notes (Signed)
 Progress Note  Physician: Lyndell Gillyard A Riverlyn Kizziah, MD   HPI: Randall Harris is a 58 y.o. male presenting on 08/21/2024 for .  Discussed the use of AI scribe software for clinical note transcription with the patient, who gave verbal consent to proceed.  History of Present Illness   [name] is a 58 year old male with hypertension who presents with headaches and dizziness.  Hypertension and associated symptoms - Hypertension with home blood pressure readings around 144/94 mmHg and a clinic reading of 160 mmHg - Current antihypertensive regimen: losartan  50 mg, recently restarted  - Blood pressure remains elevated despite current losartan  use, with occasional low readings - Associated symptoms include headaches and dizziness, attributed to elevated blood pressure  Gastrointestinal symptoms - Chronic diarrhea for approximately six months, with bowel movements described as mostly diarrhea, occasional solid stools, urgency, and explosive episodes - Stomach pain and persistent bloating - Low appetite, typically eating once daily - No history of colonoscopy; history of upper endoscopy - Sleep disturbance and fatigue - Poor sleep quality, averaging four to five hours per night  Psychological stress and depression - Experiencing significant stress and symptoms of depression, related to inability to work and chronic pain  Tobacco and alcohol use - Smokes two to two and a half packs of cigarettes per day - Multiple unsuccessful attempts to quit smoking; uses smoking as a coping mechanism for stress - Occasional alcohol consumption, with a recent twelve-pack of beer lasting several days - No illicit drug use     Smoking/Tobacco Cessation Counseling TAIJON VINK is a current user of tobacco or nicotine products. He is considering quitting at this time. Counseling provided today addressed the risks of continued use and the benefits of cessation. Discussed tobacco/nicotine  use history, readiness to quit, and evidence-based treatment options including behavioral strategies, support resources, and pharmacologic therapies. Provided encouragement and steps and resources to quit smoking. Patient questions were addressed, and follow-up recommended for continued support. Total time spent on counseling: 10 minutes.      Medical history:  Relevant past medical, surgical, family and social history reviewed and updated as indicated. Interim medical history since our last visit reviewed.  Allergies and medications reviewed and updated.   ROS: Negative unless specifically indicated above in HPI.    Current Outpatient Medications:    buPROPion (WELLBUTRIN SR) 150 MG 12 hr tablet, Take 1 tablet (150 mg total) by mouth 2 (two) times daily. 150 mg once daily in the morning; if tolerated, after 3 days, may increase to a target dose of 150 mg twice daily, Disp: 180 tablet, Rfl: 1   chlorthalidone (HYGROTON) 25 MG tablet, Take 1 tablet (25 mg total) by mouth daily., Disp: 90 tablet, Rfl: 1   losartan  (COZAAR ) 50 MG tablet, Take 1 tablet (50 mg total) by mouth daily., Disp: 90 tablet, Rfl: 1       Objective:     BP (!) 141/94   Pulse (!) 110   Ht 5' 10 (1.778 m)   Wt 240 lb 3.2 oz (109 kg)   SpO2 96%   BMI 34.47 kg/m   Wt Readings from Last 3 Encounters:  08/21/24 240 lb 3.2 oz (109 kg)  08/12/24 238 lb 9.6 oz (108.2 kg)  05/23/22 240 lb (108.9 kg)    Physical Exam  Physical Exam Vitals reviewed.  Constitutional:      Appearance: Normal appearance. Well-developed with normal weight.  Cardiovascular:     Rate and Rhythm: Normal rate and regular rhythm. Normal heart sounds. Normal peripheral pulses Pulmonary:     Normal breath sounds with normal effort Skin:    General: Skin is warm and dry without noticeable rash. Neurological:     General: No focal deficit present.  Psychiatric:        Mood and Affect: Mood, behavior and cognition normal  Abd:  Soft,  NT, obese     Assessment & Plan:   Encounter Diagnoses  Name Primary?   Other secondary hypertension Yes   Tobacco abuse    Mild depression     No orders of the defined types were placed in this encounter.    Assessment and Plan    Resistant hypertension - - Added chlorthalidone 25 mg daily to losartan . - Instructed to monitor blood pressure at home and maintain a log. - Advised to watch for dizziness or lightheadedness. - Scheduled follow-up in two weeks to review blood pressure log.  Chronic diarrhea and abdominal pain Chronic diarrhea over six months with occasional abdominal pain. Differential includes bacterial infection or gastrointestinal issues. - Recommended over-the-counter loperamide.  Tobacco use disorder Smoking two to two and a half packs daily. Previous cessation attempts unsuccessful. Smoking contributes to hypertension and health issues. - Started Wellbutrin 150 mg daily for smoking cessation and mood improvement. - Encouraged reduction in smoking with goal of halving usage. - Discussed benefits of smoking cessation on blood pressure and health. - Scheduled CT scan for November 18th.  Depression - Started Wellbutrin 150 mg daily for mood and smoking cessation. - Instructed to monitor for adverse effects  Insomnia - may improve with Wellbutrin

## 2024-08-26 ENCOUNTER — Ambulatory Visit: Admission: RE | Admit: 2024-08-26 | Source: Ambulatory Visit

## 2024-08-28 DIAGNOSIS — R079 Chest pain, unspecified: Secondary | ICD-10-CM | POA: Diagnosis not present

## 2024-09-11 ENCOUNTER — Ambulatory Visit

## 2024-09-11 VITALS — BP 130/90 | HR 94 | Ht 70.0 in | Wt 238.8 lb

## 2024-09-11 DIAGNOSIS — G4709 Other insomnia: Secondary | ICD-10-CM | POA: Diagnosis not present

## 2024-09-11 DIAGNOSIS — I158 Other secondary hypertension: Secondary | ICD-10-CM | POA: Diagnosis not present

## 2024-09-11 DIAGNOSIS — J329 Chronic sinusitis, unspecified: Secondary | ICD-10-CM | POA: Insufficient documentation

## 2024-09-11 DIAGNOSIS — G4700 Insomnia, unspecified: Secondary | ICD-10-CM | POA: Insufficient documentation

## 2024-09-11 DIAGNOSIS — Z720 Tobacco use: Secondary | ICD-10-CM

## 2024-09-11 MED ORDER — FLUTICASONE PROPIONATE 50 MCG/ACT NA SUSP: 2.0000 | Freq: Every day | NASAL | 6 refills | Status: DC

## 2024-09-11 MED ORDER — RAMELTEON 8 MG PO TABS: 8.0000 mg | ORAL_TABLET | Freq: Every day | ORAL | 0 refills | Status: AC

## 2024-09-11 NOTE — Progress Notes (Signed)
 Progress Note  Physician: Cledith Kamiya A Saidee Geremia, MD   HPI: Randall Harris is a 58 y.o. male presenting on 09/11/2024 for Follow-up (Patient states his BP is still high. Would like a different medication.) .  Discussed the use of AI scribe software for clinical note transcription with the patient, who gave verbal consent to proceed.  History of Present Illness   Randall Harris is a 58 year old male with hypertension who presents for blood pressure management and sinus congestion.  Hypertension - Blood pressure remains elevated with home readings of 138/95 mmHg and 130/90 mmHg. - Currently taking chlorthalidone  25 mg and losartan  50 mg daily. - No dizziness or lightheadedness. - No chest pain, shortness of breath, or fatigue.  Chronic sinus congestion - Persistent nasal congestion described as complete blockage with no discharge. - No fever or chills. - Symptoms ongoing for over a month. - Occasional use of Vicks inhaler; not using nasal spray. - History of chronic sinus issues since childhood, including recurrent sinus and ear infections. - Multiple sets of ear tubes in the past.  Tobacco use and smoking cessation - Currently smoking just over two packs of cigarettes per day, reduced from three packs. - Taking Wellbutrin  for smoking cessation.  Sleep disturbance - Sleeping four to five hours per night. - No prior use of sleep medication. - Open to trying medication to improve sleep quality.  Physical activity limitation - Difficulty standing for long periods, limiting physical activity.   Medical history:  Relevant past medical, surgical, family and social history reviewed and updated as indicated. Interim medical history since our last visit reviewed.  Allergies and medications reviewed and updated.   ROS: Negative unless specifically indicated above in HPI.    Current Outpatient Medications:    buPROPion  (WELLBUTRIN  SR) 150 MG 12 hr tablet, Take 1  tablet (150 mg total) by mouth 2 (two) times daily. 150 mg once daily in the morning; if tolerated, after 3 days, may increase to a target dose of 150 mg twice daily, Disp: 180 tablet, Rfl: 1   chlorthalidone  (HYGROTON ) 25 MG tablet, Take 1 tablet (25 mg total) by mouth daily., Disp: 90 tablet, Rfl: 1   fluticasone (FLONASE) 50 MCG/ACT nasal spray, Place 2 sprays into both nostrils daily., Disp: 16 g, Rfl: 6   losartan  (COZAAR ) 50 MG tablet, Take 1 tablet (50 mg total) by mouth daily., Disp: 90 tablet, Rfl: 1       Objective:     BP (!) 130/90   Pulse 94   Ht 5' 10 (1.778 m)   Wt 238 lb 12.8 oz (108.3 kg)   SpO2 97%   BMI 34.26 kg/m   Wt Readings from Last 3 Encounters:  09/11/24 238 lb 12.8 oz (108.3 kg)  08/21/24 240 lb 3.2 oz (109 kg)  08/12/24 238 lb 9.6 oz (108.2 kg)    Physical Exam  Physical Exam Vitals reviewed.  Constitutional:      Appearance: Normal appearance. Well-developed with normal weight.  Cardiovascular:     Rate and Rhythm: Normal rate and regular rhythm. Normal heart sounds. Normal peripheral pulses Pulmonary:     Normal breath sounds with normal effort Skin:    General: Skin is warm and dry without noticeable rash. Neurological:     General: No focal deficit present.  Psychiatric:        Mood and Affect: Mood, behavior and cognition normal  Assessment & Plan:   Encounter Diagnoses  Name Primary?   Tobacco abuse Yes   Other secondary hypertension    Chronic sinusitis, unspecified location     No orders of the defined types were placed in this encounter.    Assessment and Plan    Hypertension Blood pressure suboptimally controlled on current regimen. - Increased losartan  to 100 mg daily. Continue chlorthalidone  25.  Improved.   Chronic sinusitis Persistent nasal congestion without discharge. Chronic condition with recurrent infections. - Prescribed fluticasone - Instructed on proper nasal spray technique and use of saline flush  before application.  Nicotine dependence, cigarettes Currently smoking over two packs per day. Smoking cessation critical for health and blood pressure management. - Encouraged reduction to one pack per day. - Advised on behavioral strategies to reduce smoking.  Insomnia Sleeping four to five hours per night. Open to medication for sleep improvement. - Prescribed Ramelteon, to be taken one hour before bed.      F/u week after Xmas for BP, insomnia and smoking.

## 2024-10-06 ENCOUNTER — Ambulatory Visit

## 2024-10-14 MED ORDER — LOSARTAN POTASSIUM 100 MG PO TABS: 100.0000 mg | ORAL_TABLET | Freq: Every day | ORAL | 0 refills | Status: AC

## 2024-10-28 ENCOUNTER — Telehealth: Payer: Self-pay

## 2024-10-28 NOTE — Telephone Encounter (Signed)
 Copied from CRM [id number]. Topic: General - Other >> Oct 28, 2024  9:10 AM Mesmerise C wrote: Reason for CRM: Andrea from [facility] needing to speak to someone regarding authorizations for radiology would like a wall back at [phone] ext [phone] patient's appt for his CT is on 1/23

## 2024-10-31 ENCOUNTER — Ambulatory Visit: Admission: RE | Admit: 2024-10-31 | Discharge: 2024-10-31 | Disposition: A | Source: Ambulatory Visit

## 2024-10-31 DIAGNOSIS — Z122 Encounter for screening for malignant neoplasm of respiratory organs: Secondary | ICD-10-CM | POA: Insufficient documentation

## 2024-11-04 ENCOUNTER — Ambulatory Visit: Payer: Self-pay

## 2024-11-07 ENCOUNTER — Ambulatory Visit

## 2024-11-12 ENCOUNTER — Encounter: Payer: Self-pay | Admitting: Internal Medicine

## 2024-11-12 ENCOUNTER — Ambulatory Visit
Admission: RE | Admit: 2024-11-12 | Discharge: 2024-11-12 | Disposition: A | Source: Ambulatory Visit | Attending: Internal Medicine | Admitting: Internal Medicine

## 2024-11-12 ENCOUNTER — Ambulatory Visit: Admitting: Internal Medicine

## 2024-11-12 VITALS — BP 132/84 | Ht 70.0 in | Wt 226.2 lb

## 2024-11-12 DIAGNOSIS — E66811 Obesity, class 1: Secondary | ICD-10-CM | POA: Diagnosis not present

## 2024-11-12 DIAGNOSIS — G8929 Other chronic pain: Secondary | ICD-10-CM | POA: Insufficient documentation

## 2024-11-12 DIAGNOSIS — M546 Pain in thoracic spine: Secondary | ICD-10-CM | POA: Diagnosis not present

## 2024-11-12 DIAGNOSIS — M5441 Lumbago with sciatica, right side: Secondary | ICD-10-CM | POA: Diagnosis not present

## 2024-11-12 DIAGNOSIS — Z6832 Body mass index (BMI) 32.0-32.9, adult: Secondary | ICD-10-CM | POA: Diagnosis not present

## 2024-11-12 DIAGNOSIS — E6609 Other obesity due to excess calories: Secondary | ICD-10-CM | POA: Insufficient documentation

## 2024-11-12 DIAGNOSIS — M5442 Lumbago with sciatica, left side: Secondary | ICD-10-CM

## 2024-11-12 DIAGNOSIS — M545 Low back pain, unspecified: Secondary | ICD-10-CM | POA: Insufficient documentation

## 2024-11-12 MED ORDER — METHOCARBAMOL 500 MG PO TABS: 500.0000 mg | ORAL_TABLET | Freq: Every evening | ORAL | 0 refills | Status: AC | PRN

## 2024-11-12 MED ORDER — MELOXICAM 15 MG PO TABS: 15.0000 mg | ORAL_TABLET | Freq: Every day | ORAL | 0 refills | Status: AC

## 2024-11-12 NOTE — Patient Instructions (Signed)
 Back Exercises: Strengthening  Intro  Back exercises can help you get better after an injury or make weak muscles stronger.  These exercises help to make your trunk (torso) and back strong. They also help to keep the lower back flexible. Doing these exercises can help to prevent or lessen pain in your lower back.  If you have back pain, try to do these exercises 2-3 times each day or as told by your health care provider.  As you get better, do the exercises once each day. Repeat the exercises more often as told by your provider.  To stop back pain from coming back, do the exercises once each day, or as told by your provider.  Stop if you feel pain or if your pain gets worse. Do not start these exercises until told by your provider.  Exercises  Single knee to chest  Repeat these steps 3-5 times for each leg:  Lie on your back on a firm bed or the floor with your legs stretched out.  Bring one knee to your chest.  Grab your knee or thigh with both hands and hold it in place.  Pull on your knee until you feel a gentle stretch in your lower back or butt.  Stay in this position for 10-30 seconds.  Slowly let go of your leg and straighten it.    Pelvic tilt  Repeat these steps 5-10 times:  Lie on your back on a firm bed or the floor with your legs stretched out.  Bend your knees so they point up to the ceiling. Your feet should be flat on the floor.  Tighten your lower belly (abdomen) muscles to press your lower back against the floor. This will make your tailbone point up to the ceiling instead of pointing down to your feet or the floor.  Stay in this position for 5-10 seconds while you gently tighten your muscles and breathe evenly.    Cat-cow  Do these steps until your lower back bends more easily:  Get on your hands and knees on a firm bed or the floor. Keep your hands under your shoulders, and keep your knees under your hips. You may put padding under your knees.  Let your head hang down toward your chest. Tighten  (contract) the muscles in your belly. Point your tailbone toward the floor so your lower back becomes rounded like the back of a cat.  Stay in this position for 5 seconds.  Slowly lift your head. Let the muscles of your belly relax. Point your tailbone up toward the ceiling so your back forms a sagging arch like the back of a cow.  Stay in this position for 5 seconds.    Press-ups  Do these steps 5-10 times in a row:  Lie on your belly (face-down) on a firm bed or the floor.  Place your hands near your head, about shoulder-width apart.  While you keep your back relaxed and keep your hips on the floor, slowly straighten your arms to raise the top half of your body and lift your shoulders. Do not use your back muscles. You may change where you place your hands to make yourself more comfortable.  Stay in this position for 5 seconds. Keep your back relaxed.  Slowly return to lying flat on the floor.    Bridges  Do these steps 10 times in a row:  Lie on your back on a firm bed or the floor.  Bend your knees so they point  up to the ceiling. Your feet should be flat on the floor. Your arms should be flat at your sides, next to your body.  Tighten your butt muscles and lift your butt off the floor until your waist is almost as high as your knees. If you do not feel the muscles working in your butt and the back of your thighs, slide your feet 1-2 inches (2.5-5 cm) farther away from your butt.  Stay in this position for 3-5 seconds.  Slowly lower your butt to the floor, and let your butt muscles relax.  If this exercise is too easy, try doing it with your arms crossed over your chest.  Belly (abdominal) crunches  Do these steps 5-10 times in a row:  Lie on your back on a firm bed or the floor with your legs stretched out.  Bend your knees so they point up to the ceiling. Your feet should be flat on the floor.  Cross your arms over your chest.  Tip your chin a little bit toward your chest, but do not bend your neck.  Tighten  your belly muscles and slowly raise your chest just enough to lift your shoulder blades a tiny bit off the floor. Avoid raising your body higher than that because it can put too much stress on your lower back.  Slowly lower your chest and your head to the floor.    Back lifts  Do these steps 5-10 times in a row:  Lie on your belly (face-down) with your arms at your sides, and rest your forehead on the floor.  Tighten the muscles in your legs and your butt.  Slowly lift your chest off the floor while you keep your hips on the floor. Keep the back of your head in line with the curve in your back. Look at the floor while you do this.  Stay in this position for 3-5 seconds.  Slowly lower your chest and your face to the floor.    This information is not intended to replace advice given to you by your health care provider. Make sure you discuss any questions you have with your health care provider.  Document Revised: 01/11/2024 Document Reviewed: 01/11/2024  Elsevier Patient Education ?? [employer].

## 2024-11-12 NOTE — Progress Notes (Signed)
 "  Subjective:    Patient ID: Randall Harris, male    DOB: 02-26-1966, 59 y.o.   MRN: [id number]  HPI   Discussed the use of AI scribe software for clinical note transcription with the patient, who gave verbal consent to proceed.  Randall Harris is a 59 year old male who presents with chronic lower back pain.  He has experienced lower back pain for approximately twenty years, which has recently worsened. The pain is sharp and stabbing, with occasional shooting pain down his legs, varying between both sides. The pain significantly impacts his daily activities, making it difficult to stand, walk, or sit down. 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  He has not undergone any back surgery and is currently not taking any medication for his back pain. Previous x-rays, taken a few years ago, showed what was described as a 'cloud' from his neck down, which he was told might be related to arthritis. He used to take 'goodies powders' but has since stopped. No numbness or tingling in his legs, although he experiences shooting pain past his legs at times.  He denies loss of bowel or bladder control.    Past Medical History:  Diagnosis Date   Anxiety    COPD (chronic obstructive pulmonary disease) (HCC)    Depression    Hypertension     Current Outpatient Medications  Medication Sig Dispense Refill   buPROPion  (WELLBUTRIN  SR) 150 MG 12 hr tablet Take 1 tablet (150 mg total) by mouth 2 (two) times daily. 150 mg once daily in the morning; if tolerated, after 3 days, may increase to a target dose of 150 mg twice daily 180 tablet 1   chlorthalidone  (HYGROTON ) 25 MG tablet Take 1 tablet (25 mg total) by mouth daily. 90 tablet 1   fluticasone  (FLONASE ) 50 MCG/ACT nasal spray Place 2 sprays into both nostrils daily. 16 g 6   losartan  (COZAAR ) 100 MG tablet Take 1 tablet (100 mg total) by mouth daily. 90 tablet 0   ramelteon  (ROZEREM ) 8 MG tablet Take 1  tablet (8 mg total) by mouth at bedtime. 30 tablet 0   No current facility-administered medications for this visit.    Allergies[1]  No family history on file.  Social History   Socioeconomic History   Marital status: Divorced    Spouse name: Not on file   Number of children: Not on file   Years of education: Not on file   Highest education level: GED or equivalent  Occupational History   Not on file  Tobacco Use   Smoking status: Every Day    Types: Cigarettes   Smokeless tobacco: Never  Substance and Sexual Activity   Alcohol use: Yes   Drug use: Yes   Sexual activity: Not on file  Other Topics Concern   Not on file  Social History Narrative   Not on file   Social Drivers of Health   Tobacco Use: High Risk (08/21/2024)   Patient History    Smoking Tobacco Use: Every Day    Smokeless Tobacco Use: Never    Passive Exposure: Not on file  Financial Resource Strain: High Risk (08/08/2024)   Overall Financial Resource Strain (CARDIA)    Difficulty of Paying Living Expenses: Very hard  Food Insecurity: Food Insecurity Present (08/08/2024)   Epic    Worried About Programme Researcher, Broadcasting/film/video in the Last Year: Often true    Ran  Out of Food in the Last Year: Sometimes true  Transportation Needs: No Transportation Needs (08/08/2024)   Epic    Lack of Transportation (Medical): No    Lack of Transportation (Non-Medical): No  Physical Activity: Inactive (08/08/2024)   Exercise Vital Sign    Days of Exercise per Week: 0 days    Minutes of Exercise per Session: Not on file  Stress: Stress Concern Present (08/08/2024)   [employer] of Occupational Health - Occupational Stress Questionnaire    Feeling of Stress: Rather much  Social Connections: Socially Isolated (08/08/2024)   Social Connection and Isolation Panel    Frequency of Communication with Friends and Family: Once a week    Frequency of Social Gatherings with Friends and Family: Never    Attends Religious Services:  Never    Database Administrator or Organizations: No    Attends Engineer, Structural: Not on file    Marital Status: Divorced  Intimate Partner Violence: Not on file  Depression (PHQ2-9): High Risk (09/11/2024)   Depression (PHQ2-9)    PHQ-2 Score: 13  Alcohol Screen: Low Risk (08/08/2024)   Alcohol Screen    Last Alcohol Screening Score (AUDIT): 4  Housing: Low Risk (08/08/2024)   Epic    Unable to Pay for Housing in the Last Year: No    Number of Times Moved in the Last Year: 0    Homeless in the Last Year: No  Utilities: Not on file  Health Literacy: Not on file     Constitutional: Denies fever, malaise, fatigue, headache or abrupt weight changes.  Respiratory: Denies difficulty breathing, shortness of breath, cough or sputum production.   Cardiovascular: Denies chest pain, chest tightness, palpitations or swelling in the hands or feet.  Gastrointestinal: Denies abdominal pain, bloating, constipation, diarrhea or blood in the stool.  GU: Denies urgency, frequency, pain with urination, burning sensation, blood in urine, odor or discharge. Musculoskeletal: Patient reports low back pain radiating into legs, decreased range of motion.  Denies difficulty with gait, muscle pain or joint swelling.  Skin: Denies redness, rashes, lesions or ulcercations.  Neurological: Patient reports insomnia.  Denies dizziness, difficulty with memory, difficulty with speech or problems with balance and coordination.  Psych: Patient has a history of depression.  Denies anxiety, SI/HI.  No other specific complaints in a complete review of systems (except as listed in HPI above).  Objective    BP 132/84 (BP Location: Right Arm, Patient Position: Sitting, Cuff Size: Normal)   Ht 5' 10 (1.778 m)   Wt 226 lb 3.2 oz (102.6 kg)   BMI 32.46 kg/m   Wt Readings from Last 3 Encounters:  10/31/24 240 lb (108.9 kg)  09/11/24 238 lb 12.8 oz (108.3 kg)  08/21/24 240 lb 3.2 oz (109 kg)    General:  Appears older than his stated age, obese, in NAD. Cardiovascular: Normal rate and rhythm. S1,S2 noted.  No murmur, rubs or gallops noted. Pulmonary/Chest: Normal effort and positive vesicular breath sounds. No respiratory distress. No wheezes, rales or ronchi noted.  Musculoskeletal: Decreased flexion and extension of the spine.  Normal rotation and lateral bending of the spine.  Pain with palpation over the thoracic and lumbar spine as well as the right SI joint.  He has difficulty getting from a sitting to a standing position.  Able to stand on tiptoes and heels.  Strength 4/5 BLE. Neurological: Alert and oriented. Coordination normal.    BMET    Component Value Date/Time  NA 142 08/12/2024 0848   K 4.1 08/12/2024 0848   CL 108 08/12/2024 0848   CO2 26 08/12/2024 0848   GLUCOSE 89 08/12/2024 0848   BUN 11 08/12/2024 0848   CREATININE 0.90 08/12/2024 0848   CALCIUM 9.3 08/12/2024 0848    Lipid Panel     Component Value Date/Time   CHOL 170 08/12/2024 0848   TRIG 154 (H) 08/12/2024 0848   HDL 59 08/12/2024 0848   CHOLHDL 2.9 08/12/2024 0848   LDLCALC 85 08/12/2024 0848    CBC    Component Value Date/Time   WBC 7.3 08/12/2024 0848   RBC 5.85 (H) 08/12/2024 0848   HGB 15.8 08/12/2024 0848   HCT 50.0 08/12/2024 0848   PLT 347 08/12/2024 0848   MCV 85.5 08/12/2024 0848   MCH 27.0 08/12/2024 0848   MCHC 31.6 (L) 08/12/2024 0848   RDW 14.4 08/12/2024 0848   EOSABS 117 08/12/2024 0848   BASOSABS 29 08/12/2024 0848    Hgb A1C Lab Results  Component Value Date   HGBA1C 5.4 08/12/2024       Assessment and Plan     Assessment and Plan    Chronic low back pain with bilateral sciatica Chronic low back pain with sciatica, likely due to arthritis or pinched nerve. Previous x-rays showed arthritis-like changes. MRI considered if symptoms persist. - Prescribed meloxicam  15 mg for daily anti-inflammatory use. - Advised against Advil, Motrin, or ibuprofen while on  meloxicam . - Allowed use of Tylenol as needed. - Ordered x-ray of thoracic and lumbar spine. - Prescribed methocarbamol  500 mg for nighttime use as a muscle relaxer-sedation caution given. - Discussed potential need for MRI if symptoms persist after x-ray and physical therapy.  Chronic thoracic back pain Chronic thoracic back pain. - Prescribe meloxicam  15 mg for daily anti-inflammatory use - Advised against Advil, Motrin, or ibuprofen while on meloxicam . - Allowed use of Tylenol as needed. - Ordered x-ray of thoracic and lumbar spine. - Prescribed methocarbamol  500 mg for nighttime use as a muscle relaxer-sedation caution given. - Discussed potential need for MRI if symptoms persist after x-ray and physical therapy.  RTC in 3 months for your annual exam  Angeline Laura, NP     [1] No Known Allergies  "

## 2024-11-12 NOTE — Assessment & Plan Note (Signed)
 Encouraged diet and exercise for weight loss ?

## 2024-11-14 ENCOUNTER — Ambulatory Visit: Payer: Self-pay | Admitting: Internal Medicine

## 2024-11-14 DIAGNOSIS — G8929 Other chronic pain: Secondary | ICD-10-CM

## 2024-11-14 NOTE — Telephone Encounter (Signed)
 Referral placed

## 2025-02-23 ENCOUNTER — Encounter: Admitting: Internal Medicine
